# Patient Record
Sex: Male | Born: 1990 | Race: White | Hispanic: No | Marital: Single | Smoking: Former smoker
Health system: Southern US, Community
[De-identification: ages and names within clinical notes are randomized; demographics above are authoritative.]

## PROBLEM LIST (undated history)

## (undated) DIAGNOSIS — Z87820 Personal history of traumatic brain injury: Secondary | ICD-10-CM

## (undated) DIAGNOSIS — R4020 Unspecified coma: Secondary | ICD-10-CM

## (undated) DIAGNOSIS — S42309A Unspecified fracture of shaft of humerus, unspecified arm, initial encounter for closed fracture: Secondary | ICD-10-CM

## (undated) DIAGNOSIS — L709 Acne, unspecified: Secondary | ICD-10-CM

## (undated) DIAGNOSIS — T7840XA Allergy, unspecified, initial encounter: Secondary | ICD-10-CM

## (undated) HISTORY — PX: NO PAST SURGERIES: SHX2092

## (undated) HISTORY — DX: Unspecified coma: R40.20

## (undated) HISTORY — DX: Unspecified fracture of shaft of humerus, unspecified arm, initial encounter for closed fracture: S42.309A

## (undated) HISTORY — DX: Acne, unspecified: L70.9

## (undated) HISTORY — DX: Personal history of traumatic brain injury: Z87.820

## (undated) HISTORY — DX: Allergy, unspecified, initial encounter: T78.40XA

---
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
## 2015-10-09 ENCOUNTER — Telehealth: Payer: Self-pay | Admitting: Internal Medicine

## 2015-10-09 NOTE — Telephone Encounter (Signed)
Cancel s/w [employer]

## 2015-10-10 ENCOUNTER — Encounter: Payer: Self-pay | Admitting: Internal Medicine

## 2015-10-10 ENCOUNTER — Encounter: Payer: Self-pay | Admitting: Family Medicine

## 2015-10-10 ENCOUNTER — Ambulatory Visit (INDEPENDENT_AMBULATORY_CARE_PROVIDER_SITE_OTHER): Payer: 59 | Admitting: Internal Medicine

## 2015-10-10 VITALS — BP 112/70 | Temp 97.7°F | Ht 71.5 in | Wt 183.6 lb

## 2015-10-10 DIAGNOSIS — L259 Unspecified contact dermatitis, unspecified cause: Secondary | ICD-10-CM

## 2015-10-10 DIAGNOSIS — M545 Low back pain: Secondary | ICD-10-CM

## 2015-10-10 DIAGNOSIS — Z23 Encounter for immunization: Secondary | ICD-10-CM

## 2015-10-10 NOTE — Progress Notes (Signed)
Pre visit review using our clinic review tool, if applicable. No additional management support is needed unless otherwise documented below in the visit note.   Chief Complaint  Patient presents with  . Back Pain    Lower back pain started yesterday morning.    HPI: Nicholas Freeman 24 y.o. comes in  reestablishing  for new back pain  After  Walking dog.yesterday    Dropped leash once but no pain  At the time  Border collie.  Walks well  Sleep ok . No fever . No pain meds.    Pain is  Pressure  Mid low back and sometimes radiates to right buttock area  Not to foot . No weakness  Bowel bladder issue  . Worse with   7-9    When bend over  Down legs.  Slept ok .  After repositioning  Works 30 - 3 hours per week [employer] and [employer]   Standing trays  But no   Heavy lifting  No prec known injury fam hx back issues .  ROS: See pertinent positives and negatives per HPI. 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 Reg uarly runs adn works out but no new exercise  injury  Past Medical History  Diagnosis Date  . Allergy   . Chickenpox   . Arm fracture   . Acne     Family History  Problem Relation Age of Onset  . Heart disease Paternal Grandfather     Social History   Social History  . Marital Status: Single    Spouse Name: N/A  . Number of Children: N/A  . Years of Education: N/A   Social History Main Topics  . Smoking status: Never Smoker   . Smokeless tobacco: Never Used  . Alcohol Use: 0.0 oz/week    0 Standard drinks or equivalent per week     Comment: Social  . Drug Use: None  . Sexual Activity:    Partners: Female   Other Topics Concern  . 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 minocycline (MINOCIN,DYNACIN) 100 MG capsule Take 100 mg by mouth 2 (two) times daily.      . mometasone (NASONEX) 50 MCG/ACT nasal spray 2 sprays by Nasal route daily.      Marland Kitchen meningococcal polysaccharide (MENACTRA) injection 0.5 mL      No facility-administered medications prior to visit.     EXAM:  BP 112/70 mmHg  Temp(Src) 97.7 F (36.5 C) (Oral)  Ht 5' 11.5" (1.816 m)  Wt 183 lb 9.6 oz (83.28 kg)  BMI 25.25 kg/m2  Body mass index is 25.25 kg/(m^2).  GENERAL: vitals reviewed and listed above, alert, oriented, appears well hydrated and in no acute distress some uncomfortable with certain postitions HEENT: atraumatic, conjunctiva  clear, no obvious abnormalities on inspection of external nose and ears  Mild nasal congestion  NECK: no obvious masses  on inspection palpation  LUNGS: clear to auscultation bilaterally, no wheezes, rales or rhonchi, good air movement CV: HRRR, no clubbing cyanosis or  peripheral edema nl cap refill  Abdomen:  Sof,t normal bowel sounds without hepatosplenomegaly, no guarding rebound or masses no CVA tenderness skin contact derm below midline umbilicus  MS: moves all extremities without noticeable focal  Abnormality Back localized pain mid spine lumbar area  Heel to nl strength  Some sx with right leg raise  dtrs le intact  Nl tone   PSYCH: pleasant and cooperative, no obvious depression or anxiety  ASSESSMENT AND PLAN:  Discussed the following assessment and plan:  Acute midline low back pain, with sciatica presence unspecified - ? some righ radiation no alarm features   Need for prophylactic vaccination and inoculation against influenza - Plan: Flu Vaccine QUAD 36+ mos PF IM (Fluarix & Fluzone Quad PF)  Contact dermatitis - prob nickel metal belt  dsic managment   Only concern is young age and no injury although active job and  Exercise    Expectant management. Relative back rest  dsic  Note for work   One job return 10 29 activity as tolerated ice nsaids .  Exercise when improving and back hygiene Fu as instructed see below  Counseled. About expected course   -Patient advised to return or notify health care team  if symptoms worsen ,persist or new concerns arise. prob contact derm belt  Mid abd  Avoid metal  Contact  And add cortisone  To control  Patient Instructions  This is mechanical back pain .   No alarm features   .   Relative  back rest  For 1- 3 days  Ice  As discussed . Avoid  prolonged sitting  And heavy lifting until better .   Antiinflammatory   Doses  Ibuprofen 600 - 800 mg every 8 hours   Until improved .   When improved back exercises to maintain.   Fu if no  Improvement  In 2 weeks  Or not gone in 4 weeks.    Lumbosacral Strain Lumbosacral strain is a strain of any of the parts that make up your lumbosacral vertebrae. Your lumbosacral vertebrae are the bones that make up the lower third of your backbone. Your lumbosacral vertebrae are held together by muscles and tough, fibrous tissue (ligaments).  CAUSES  A sudden blow to your back can cause lumbosacral strain. Also, anything that causes an excessive stretch of the muscles in the low back can cause this strain. This is typically seen when people exert themselves strenuously, fall, lift heavy objects, bend, or crouch repeatedly. RISK FACTORS 1. Physically demanding work. 2. Participation in pushing or pulling sports or sports that require a sudden twist of the back (tennis, golf, baseball). 3. Weight lifting. 4. Excessive lower back curvature. 5. Forward-tilted pelvis. 6. Weak back or abdominal muscles or both. 7. Tight hamstrings. SIGNS AND SYMPTOMS  Lumbosacral strain may cause pain in the area of your injury or pain that moves (radiates) down your leg.  DIAGNOSIS Your  health care provider can often diagnose lumbosacral strain through a physical exam. In some cases, you may need tests such as X-ray exams.  TREATMENT  Treatment for your lower back injury depends on many factors that your clinician will have to evaluate. However, most treatment will include the use of anti-inflammatory medicines. HOME CARE INSTRUCTIONS  1. Avoid hard physical activities (tennis, racquetball, waterskiing) if you are not in proper physical condition for  it. This may aggravate or create problems. 2. If you have a back problem, avoid sports requiring sudden body movements. Swimming and walking are generally safer activities. 3. Maintain good posture. 4. Maintain a healthy weight. 5. For acute conditions, you may put ice on the injured area. 1. Put ice in a plastic bag. 2. Place a towel between your skin and the bag. 3. Leave the ice on for 20 minutes, 2-3 times a day. 6. When the low back starts healing, stretching and strengthening exercises may be recommended. SEEK MEDICAL CARE IF: 1. Your back pain is getting worse. 2. You experience severe back pain not relieved with medicines. SEEK IMMEDIATE MEDICAL CARE IF:  1. You have numbness, tingling, weakness, or problems with the use of your arms or legs. 2. There is a change in bowel or bladder control. 3. You have increasing pain in any area of the body, including your belly (abdomen). 4. You notice shortness of breath, dizziness, or feel faint. 5. You feel sick to your stomach (nauseous), are throwing up (vomiting), or become sweaty. 6. You notice discoloration of your toes or legs, or your feet get very cold. MAKE SURE YOU:  1. Understand these instructions. 2. Will watch your condition. 3. Will get help right away if you are not doing well or get worse.   This information is not intended to replace advice given to you by your health care provider. Make sure you discuss any questions you have with your health care provider.    Document Released: 09/09/2005 Document Revised: 12/21/2014 Document Reviewed: 07/19/2013 Elsevier Interactive Patient Education 2016 Elsevier Inc.  Back Exercises The following exercises strengthen the muscles that help to support the back. They also help to keep the lower back flexible. Doing these exercises can help to prevent back pain or lessen existing pain. If you have back pain or discomfort, try doing these exercises 2-3 times each day or as told by your health care provider. When the pain goes away, do them once each day, but increase the number of times that you repeat the steps for each exercise (do more repetitions). If you do not have back pain or discomfort, do these exercises once each day or as told by your health care provider. EXERCISES Single Knee to Chest Repeat these steps 3-5 times for each leg: 8. Lie on your back on a firm bed or the floor with your legs extended. 9. Bring one knee to your chest. Your other leg should stay extended and in contact with the floor. 10. Hold your knee in place by grabbing your knee or thigh. 11. Pull on your knee until you feel a gentle stretch in your lower back. 12. Hold the stretch for 10-30 seconds. 13. Slowly release and straighten your leg. Pelvic Tilt Repeat these steps 5-10 times: 7. Lie on your back on a firm bed or the floor with your legs extended. 8. Bend your knees so they are pointing toward the ceiling and your feet are flat on the floor. 9. Tighten your lower abdominal muscles to press your lower back against the floor. This motion will tilt your pelvis so your tailbone points up toward the ceiling instead of pointing to your feet or the floor. 10. With gentle tension and even breathing, hold this position for 5-10 seconds. Cat-Cow Repeat these steps until your lower back becomes more flexible: 3. Get into a hands-and-knees position on a firm surface. Keep your hands under your shoulders, and keep your knees under  your  hips. You may place padding under your knees for comfort. 4. Let your head hang down, and point your tailbone toward the floor so your lower back becomes rounded like the back of a cat. 5. Hold this position for 5 seconds. 6. Slowly lift your head and point your tailbone up toward the ceiling so your back forms a sagging arch like the back of a cow. 7. Hold this position for 5 seconds. Press-Ups Repeat these steps 5-10 times: 7. Lie on your abdomen (face-down) on the floor. 8. Place your palms near your head, about shoulder-width apart. 9. While you keep your back as relaxed as possible and keep your hips on the floor, slowly straighten your arms to raise the top half of your body and lift your shoulders. Do not use your back muscles to raise your upper torso. You may adjust the placement of your hands to make yourself more comfortable. 10. Hold this position for 5 seconds while you keep your back relaxed. 11. Slowly return to lying flat on the floor. Bridges Repeat these steps 10 times: 4. Lie on your back on a firm surface. 5. Bend your knees so they are pointing toward the ceiling and your feet are flat on the floor. 6. Tighten your buttocks muscles and lift your buttocks off of the floor until your waist is at almost the same height as your knees. You should feel the muscles working in your buttocks and the back of your thighs. If you do not feel these muscles, slide your feet 1-2 inches farther away from your buttocks. 7. Hold this position for 3-5 seconds. 8. Slowly lower your hips to the starting position, and allow your buttocks muscles to relax completely. If this exercise is too easy, try doing it with your arms crossed over your chest. Abdominal Crunches Repeat these steps 5-10 times: 1. Lie on your back on a firm bed or the floor with your legs extended. 2. Bend your knees so they are pointing toward the ceiling and your feet are flat on the floor. 3. Cross your arms over your  chest. 4. Tip your chin slightly toward your chest without bending your neck. 5. Tighten your abdominal muscles and slowly raise your trunk (torso) high enough to lift your shoulder blades a tiny bit off of the floor. Avoid raising your torso higher than that, because it can put too much stress on your low back and it does not help to strengthen your abdominal muscles. 6. Slowly return to your starting position. Back Lifts Repeat these steps 5-10 times: 1. Lie on your abdomen (face-down) with your arms at your sides, and rest your forehead on the floor. 2. Tighten the muscles in your legs and your buttocks. 3. Slowly lift your chest off of the floor while you keep your hips pressed to the floor. Keep the back of your head in line with the curve in your back. Your eyes should be looking at the floor. 4. Hold this position for 3-5 seconds. 5. Slowly return to your starting position. SEEK MEDICAL CARE IF:  Your back pain or discomfort gets much worse when you do an exercise.  Your back pain or discomfort does not lessen within 2 hours after you exercise. If you have any of these problems, stop doing these exercises right away. Do not do them again unless your health care provider says that you can. SEEK IMMEDIATE MEDICAL CARE IF:  You develop sudden, severe back pain. If this happens, stop  doing the exercises right away. Do not do them again unless your health care provider says that you can.   This information is not intended to replace advice given to you by your health care provider. Make sure you discuss any questions you have with your health care provider.   Document Released: 01/07/2005 Document Revised: 08/21/2015 Document Reviewed: 01/24/2015 Elsevier Interactive Patient Education 2016 [employer].        Gustine. Mattthew Ziomek M.D.

## 2015-10-10 NOTE — Patient Instructions (Signed)
This is mechanical back pain .   No alarm features   .   Relative  back rest  For 1- 3 days  Ice  As discussed . Avoid  prolonged sitting  And heavy lifting until better .   Antiinflammatory   Doses  Ibuprofen 600 - 800 mg every 8 hours   Until improved .   When improved back exercises to maintain.   Fu if no  Improvement  In 2 weeks  Or not gone in 4 weeks.    Lumbosacral Strain Lumbosacral strain is a strain of any of the parts that make up your lumbosacral vertebrae. Your lumbosacral vertebrae are the bones that make up the lower third of your backbone. Your lumbosacral vertebrae are held together by muscles and tough, fibrous tissue (ligaments).  CAUSES  A sudden blow to your back can cause lumbosacral strain. Also, anything that causes an excessive stretch of the muscles in the low back can cause this strain. This is typically seen when people exert themselves strenuously, fall, lift heavy objects, bend, or crouch repeatedly. RISK FACTORS 1. Physically demanding work. 2. Participation in pushing or pulling sports or sports that require a sudden twist of the back (tennis, golf, baseball). 3. Weight lifting. 4. Excessive lower back curvature. 5. Forward-tilted pelvis. 6. Weak back or abdominal muscles or both. 7. Tight hamstrings. SIGNS AND SYMPTOMS  Lumbosacral strain may cause pain in the area of your injury or pain that moves (radiates) down your leg.  DIAGNOSIS Your health care provider can often diagnose lumbosacral strain through a physical exam. In some cases, you may need tests such as X-ray exams.  TREATMENT  Treatment for your lower back injury depends on many factors that your clinician will have to evaluate. However, most treatment will include the use of anti-inflammatory medicines. HOME CARE INSTRUCTIONS  1. Avoid hard physical activities (tennis, racquetball, waterskiing) if you are not in proper physical condition for it. This may aggravate or create problems. 2. If  you have a back problem, avoid sports requiring sudden body movements. Swimming and walking are generally safer activities. 3. Maintain good posture. 4. Maintain a healthy weight. 5. For acute conditions, you may put ice on the injured area. 1. Put ice in a plastic bag. 2. Place a towel between your skin and the bag. 3. Leave the ice on for 20 minutes, 2-3 times a day. 6. When the low back starts healing, stretching and strengthening exercises may be recommended. SEEK MEDICAL CARE IF: 1. Your back pain is getting worse. 2. You experience severe back pain not relieved with medicines. SEEK IMMEDIATE MEDICAL CARE IF:  1. You have numbness, tingling, weakness, or problems with the use of your arms or legs. 2. There is a change in bowel or bladder control. 3. You have increasing pain in any area of the body, including your belly (abdomen). 4. You notice shortness of breath, dizziness, or feel faint. 5. You feel sick to your stomach (nauseous), are throwing up (vomiting), or become sweaty. 6. You notice discoloration of your toes or legs, or your feet get very cold. MAKE SURE YOU:  1. Understand these instructions. 2. Will watch your condition. 3. Will get help right away if you are not doing well or get worse.   This information is not intended to replace advice given to you by your health care provider. Make sure you discuss any questions you have with your health care provider.   Document Released: 09/09/2005 Document Revised: 12/21/2014  Document Reviewed: 07/19/2013 Elsevier Interactive Patient Education 2016 [employer]  Back Exercises The following exercises strengthen the muscles that help to support the back. They also help to keep the lower back flexible. Doing these exercises can help to prevent back pain or lessen existing pain. If you have back pain or discomfort, try doing these exercises 2-3 times each day or as told by your health care provider. When the pain goes away, do  them once each day, but increase the number of times that you repeat the steps for each exercise (do more repetitions). If you do not have back pain or discomfort, do these exercises once each day or as told by your health care provider. EXERCISES Single Knee to Chest Repeat these steps 3-5 times for each leg: 8. Lie on your back on a firm bed or the floor with your legs extended. 9. Bring one knee to your chest. Your other leg should stay extended and in contact with the floor. 10. Hold your knee in place by grabbing your knee or thigh. 11. Pull on your knee until you feel a gentle stretch in your lower back. 12. Hold the stretch for 10-30 seconds. 13. Slowly release and straighten your leg. Pelvic Tilt Repeat these steps 5-10 times: 7. Lie on your back on a firm bed or the floor with your legs extended. 8. Bend your knees so they are pointing toward the ceiling and your feet are flat on the floor. 9. Tighten your lower abdominal muscles to press your lower back against the floor. This motion will tilt your pelvis so your tailbone points up toward the ceiling instead of pointing to your feet or the floor. 10. With gentle tension and even breathing, hold this position for 5-10 seconds. Cat-Cow Repeat these steps until your lower back becomes more flexible: 3. Get into a hands-and-knees position on a firm surface. Keep your hands under your shoulders, and keep your knees under your hips. You may place padding under your knees for comfort. 4. Let your head hang down, and point your tailbone toward the floor so your lower back becomes rounded like the back of a cat. 5. Hold this position for 5 seconds. 6. Slowly lift your head and point your tailbone up toward the ceiling so your back forms a sagging arch like the back of a cow. 7. Hold this position for 5 seconds. Press-Ups Repeat these steps 5-10 times: 7. Lie on your abdomen (face-down) on the floor. 8. Place your palms near your head,  about shoulder-width apart. 9. While you keep your back as relaxed as possible and keep your hips on the floor, slowly straighten your arms to raise the top half of your body and lift your shoulders. Do not use your back muscles to raise your upper torso. You may adjust the placement of your hands to make yourself more comfortable. 10. Hold this position for 5 seconds while you keep your back relaxed. 11. Slowly return to lying flat on the floor. Bridges Repeat these steps 10 times: 4. Lie on your back on a firm surface. 5. Bend your knees so they are pointing toward the ceiling and your feet are flat on the floor. 6. Tighten your buttocks muscles and lift your buttocks off of the floor until your waist is at almost the same height as your knees. You should feel the muscles working in your buttocks and the back of your thighs. If you do not feel these muscles, slide your feet 1-2  inches farther away from your buttocks. 7. Hold this position for 3-5 seconds. 8. Slowly lower your hips to the starting position, and allow your buttocks muscles to relax completely. If this exercise is too easy, try doing it with your arms crossed over your chest. Abdominal Crunches Repeat these steps 5-10 times: 1. Lie on your back on a firm bed or the floor with your legs extended. 2. Bend your knees so they are pointing toward the ceiling and your feet are flat on the floor. 3. Cross your arms over your chest. 4. Tip your chin slightly toward your chest without bending your neck. 5. Tighten your abdominal muscles and slowly raise your trunk (torso) high enough to lift your shoulder blades a tiny bit off of the floor. Avoid raising your torso higher than that, because it can put too much stress on your low back and it does not help to strengthen your abdominal muscles. 6. Slowly return to your starting position. Back Lifts Repeat these steps 5-10 times: 1. Lie on your abdomen (face-down) with your arms at your  sides, and rest your forehead on the floor. 2. Tighten the muscles in your legs and your buttocks. 3. Slowly lift your chest off of the floor while you keep your hips pressed to the floor. Keep the back of your head in line with the curve in your back. Your eyes should be looking at the floor. 4. Hold this position for 3-5 seconds. 5. Slowly return to your starting position. SEEK MEDICAL CARE IF:  Your back pain or discomfort gets much worse when you do an exercise.  Your back pain or discomfort does not lessen within 2 hours after you exercise. If you have any of these problems, stop doing these exercises right away. Do not do them again unless your health care provider says that you can. SEEK IMMEDIATE MEDICAL CARE IF:  You develop sudden, severe back pain. If this happens, stop doing the exercises right away. Do not do them again unless your health care provider says that you can.   This information is not intended to replace advice given to you by your health care provider. Make sure you discuss any questions you have with your health care provider.   Document Released: 01/07/2005 Document Revised: 08/21/2015 Document Reviewed: 01/24/2015 Elsevier Interactive Patient Education [employer]

## 2020-01-29 ENCOUNTER — Ambulatory Visit: Payer: Self-pay | Attending: Internal Medicine

## 2020-01-29 DIAGNOSIS — Z23 Encounter for immunization: Secondary | ICD-10-CM | POA: Insufficient documentation

## 2020-01-29 NOTE — Progress Notes (Signed)
   Covid-19 Vaccination Clinic  Name:  Nicholas Freeman    MRN: [id number] DOB: 06/16/1991  01/29/2020  [name] was observed post Covid-19 immunization for 15 minutes without incidence. He was provided with Vaccine Information Sheet and instruction to access the V-Safe system.   [name] was instructed to call 911 with any severe reactions post vaccine: Marland Kitchen Difficulty breathing  . Swelling of your face and throat  . A fast heartbeat  . A bad rash all over your body  . Dizziness and weakness    Immunizations Administered    Name Date Dose VIS Date Route   Pfizer COVID-19 Vaccine 01/29/2020  7:19 PM 0.3 mL 11/24/2019 Intramuscular   Manufacturer: [employer], [employer]   Lot: [id number]   NDC: [address]-7

## 2020-02-20 ENCOUNTER — Ambulatory Visit: Payer: Self-pay | Attending: Internal Medicine

## 2020-02-20 DIAGNOSIS — Z23 Encounter for immunization: Secondary | ICD-10-CM

## 2020-02-20 NOTE — Progress Notes (Signed)
   Covid-19 Vaccination Clinic  Name:  Hugh Kamara    MRN: [id number] DOB: [birth year]  02/20/2020  Mr. Borges was observed post Covid-19 immunization for 15 minutes without incident. He was provided with Vaccine Information Sheet and instruction to access the V-Safe system.   Mr. Gilliand was instructed to call 911 with any severe reactions post vaccine: Marland Kitchen Difficulty breathing  . Swelling of face and throat  . A fast heartbeat  . A bad rash all over body  . Dizziness and weakness   Immunizations Administered    Name Date Dose VIS Date Route   Pfizer COVID-19 Vaccine 02/20/2020  4:37 PM 0.3 mL 11/24/2019 Intramuscular   Manufacturer: [employer], [employer]   Lot: [id number]   NDC: [address]-4

## 2020-02-21 ENCOUNTER — Ambulatory Visit: Payer: Self-pay

## 2020-03-05 ENCOUNTER — Emergency Department (HOSPITAL_COMMUNITY)
Admission: EM | Admit: 2020-03-05 | Discharge: 2020-03-05 | Disposition: A | Discharge status: Home / Self Care | Payer: BLUE CROSS/BLUE SHIELD | Attending: Emergency Medicine | Admitting: Emergency Medicine

## 2020-03-05 ENCOUNTER — Emergency Department (HOSPITAL_COMMUNITY): Payer: BLUE CROSS/BLUE SHIELD

## 2020-03-05 ENCOUNTER — Encounter (HOSPITAL_COMMUNITY): Payer: Self-pay | Admitting: Pharmacy Technician

## 2020-03-05 ENCOUNTER — Other Ambulatory Visit: Payer: Self-pay

## 2020-03-05 DIAGNOSIS — R55 Syncope and collapse: Secondary | ICD-10-CM | POA: Diagnosis present

## 2020-03-05 DIAGNOSIS — R4020 Unspecified coma: Secondary | ICD-10-CM

## 2020-03-05 LAB — COMPREHENSIVE METABOLIC PANEL
ALT: 40 U/L (ref 0–44)
AST: 25 U/L (ref 15–41)
Albumin: 4.3 g/dL (ref 3.5–5.0)
Alkaline Phosphatase: 65 U/L (ref 38–126)
Anion gap: 13 (ref 5–15)
BUN: 7 mg/dL (ref 6–20)
CO2: 19 mmol/L — ABNORMAL LOW (ref 22–32)
Calcium: 9.5 mg/dL (ref 8.9–10.3)
Chloride: 107 mmol/L (ref 98–111)
Creatinine, Ser: 1.01 mg/dL (ref 0.61–1.24)
GFR calc Af Amer: >60 mL/min (ref >60)
GFR calc non Af Amer: >60 mL/min (ref >60)
Glucose, Bld: 162 mg/dL — ABNORMAL HIGH (ref 70–99)
Potassium: 4.5 mmol/L (ref 3.5–5.1)
Sodium: 139 mmol/L (ref 135–145)
Total Bilirubin: 0.9 mg/dL (ref 0.3–1.2)
Total Protein: 6.9 g/dL (ref 6.5–8.1)

## 2020-03-05 LAB — RAPID URINE DRUG SCREEN, HOSP PERFORMED
Amphetamines: NOT DETECTED
Barbiturates: NOT DETECTED
Benzodiazepines: NOT DETECTED
Cocaine: NOT DETECTED
Opiates: NOT DETECTED
Tetrahydrocannabinol: POSITIVE — AB

## 2020-03-05 LAB — URINALYSIS, ROUTINE W REFLEX MICROSCOPIC
Bacteria, UA: NONE SEEN
Bilirubin Urine: NEGATIVE
Glucose, UA: 50 mg/dL — AB
Ketones, ur: NEGATIVE mg/dL
Leukocytes,Ua: NEGATIVE
Nitrite: NEGATIVE
Protein, ur: 100 mg/dL — AB
Specific Gravity, Urine: 1.014 (ref 1.005–1.030)
pH: 5 (ref 5.0–8.0)

## 2020-03-05 LAB — CBC WITH DIFFERENTIAL/PLATELET
Abs Immature Granulocytes: 0.1 10*3/uL — ABNORMAL HIGH (ref 0.00–0.07)
Basophils Absolute: 0.1 10*3/uL (ref 0.0–0.1)
Basophils Relative: 1 %
Eosinophils Absolute: 0.1 10*3/uL (ref 0.0–0.5)
Eosinophils Relative: 2 %
HCT: 51.6 % (ref 39.0–52.0)
Hemoglobin: 17.2 g/dL — ABNORMAL HIGH (ref 13.0–17.0)
Immature Granulocytes: 2 %
Lymphocytes Relative: 20 %
Lymphs Abs: 1.1 10*3/uL (ref 0.7–4.0)
MCH: 30.2 pg (ref 26.0–34.0)
MCHC: 33.3 g/dL (ref 30.0–36.0)
MCV: 90.7 fL (ref 80.0–100.0)
Monocytes Absolute: 0.3 10*3/uL (ref 0.1–1.0)
Monocytes Relative: 6 %
Neutro Abs: 3.9 10*3/uL (ref 1.7–7.7)
Neutrophils Relative %: 69 %
Platelets: UNDETERMINED 10*3/uL (ref 150–400)
RBC: 5.69 MIL/uL (ref 4.22–5.81)
RDW: 12.4 % (ref 11.5–15.5)
WBC: 5.6 10*3/uL (ref 4.0–10.5)
nRBC: 0 % (ref 0.0–0.2)

## 2020-03-05 LAB — CBG MONITORING, ED: Glucose-Capillary: 146 mg/dL — ABNORMAL HIGH (ref 70–99)

## 2020-03-05 MED ORDER — SODIUM CHLORIDE 0.9 % IV BOLUS
1000.0000 mL | Freq: Once | INTRAVENOUS | Status: AC
  Administered 2020-03-05: 11:00:00 1000 mL via INTRAVENOUS

## 2020-03-05 MED ORDER — LEVETIRACETAM IN NACL 1500 MG/100ML IV SOLN
1500.0000 mg | Freq: Once | INTRAVENOUS | Status: AC
  Administered 2020-03-05: 1500 mg via INTRAVENOUS
  Filled 2020-03-05: qty 100

## 2020-03-05 MED ORDER — LEVETIRACETAM 500 MG PO TABS: 500.0000 mg | ORAL_TABLET | Freq: Two times a day (BID) | ORAL | 0 refills | Status: DC

## 2020-03-05 MED ORDER — ACETAMINOPHEN 500 MG PO TABS
1000.0000 mg | ORAL_TABLET | Freq: Once | ORAL | Status: AC
  Administered 2020-03-05: 1000 mg via ORAL
  Filled 2020-03-05: qty 2

## 2020-03-05 NOTE — ED Provider Notes (Signed)
[facility] EMERGENCY DEPARTMENT Provider Note   CSN: [id number] Arrival date & time: 03/05/20  1042     History Chief Complaint  Patient presents with  . Loss of Consciousness    Nicholas Freeman is a 29 y.o. male.  HPI   Patient is a 29 year old male with a history of acne, allergies, who presents the emergency department today for evaluation for loss of consciousness.  Patient states he woke up this morning and was very sweaty.  He walked to the bathroom to take a shower and sat on the toilet.  He continued to feel diaphoretic and lightheaded.  He also had some tingling in his bilateral hands.  He stood up from the toilet and attempted to get in the shower and the next thing he knew he was found on the floor by his mother.  Per EMS, patient's mother did not witness any tonic-clonic movements or seizure-like activity though on EMS arrival they felt that he was postictal as he was disoriented.  There was no urinary incontinence and there is no report of tongue biting.  Patient reports he has been in his normal state of health prior to the episode today.    He did have a similar episode that occurred a few months ago.  He was evaluated by his PCP and told to rest and take it easy.  He did not have any further follow-up after this incident.  He denies any recent fevers, visual changes, dizziness, lightheadedness, unilateral numbness/weakness. He does have headache currently. He denies any injuries or pain from the fall. Denies any frequent use of alcohol.  He does use marijuana but denies other drug use.  Past Medical History:  Diagnosis Date  . Acne   . Allergy   . Arm fracture   . Chickenpox     Patient Active Problem List   Diagnosis Date Noted  . Immunization due 02/26/2011  . ABDOMINAL PAIN OTHER SPECIFIED SITE 02/28/2010  . INJURY OF FACE AND NECK OTHER AND UNSPECIFIED 02/28/2010    Past Surgical History:  Procedure Laterality Date  . NO PAST SURGERIES          Family History  Problem Relation Age of Onset  . Heart disease Paternal Grandfather     Social History   Tobacco Use  . Smoking status: Never Smoker  . Smokeless tobacco: Never Used  Substance Use Topics  . Alcohol use: Yes    Alcohol/week: 0.0 standard drinks    Comment: Social  . Drug use: Not on file    Home Medications Prior to Admission medications   Medication Sig Start Date End Date Taking? Authorizing Provider  levETIRAcetam (KEPPRA) 500 MG tablet Take 1 tablet (500 mg total) by mouth 2 (two) times daily. 03/05/20 05/04/20  Kellyann Ordway S, PA-C    Allergies    Shellfish allergy  Review of Systems   Review of Systems  Constitutional: Negative for fever.  HENT: Negative for ear pain and sore throat.   Eyes: Negative for visual disturbance.  Respiratory: Negative for cough and shortness of breath.   Cardiovascular: Negative for chest pain.  Gastrointestinal: Negative for abdominal pain, constipation, diarrhea, nausea and vomiting.  Genitourinary: Negative for dysuria and hematuria.  Musculoskeletal: Negative for back pain.  Skin: Negative for rash.  Neurological: Positive for headaches. Negative for dizziness, weakness, light-headedness and numbness.       Loss of consciousness  All other systems reviewed and are negative.   Physical Exam Updated Vital  Signs BP (!) 121/100   Pulse 78   Temp (!) 97.4 F (36.3 C) (Oral)   Resp (!) 32   Ht 5\' 11"  (1.803 m)   Wt 87.1 kg   SpO2 100%   BMI 26.78 kg/m   Physical Exam Vitals and nursing note reviewed.  Constitutional:      Appearance: He is well-developed.  HENT:     Head: Normocephalic and atraumatic.     Mouth/Throat:     Comments: No bruising to the tongue. Mucous membranes are dry.  Eyes:     Extraocular Movements: Extraocular movements intact.     Conjunctiva/sclera: Conjunctivae normal.     Pupils: Pupils are equal, round, and reactive to light.     Comments: No nystagmus   Cardiovascular:     Rate and Rhythm: Normal rate and regular rhythm.     Pulses: Normal pulses.     Heart sounds: Normal heart sounds. No murmur.  Pulmonary:     Effort: Pulmonary effort is normal. No respiratory distress.     Breath sounds: Normal breath sounds. No wheezing, rhonchi or rales.  Abdominal:     General: Bowel sounds are normal.     Palpations: Abdomen is soft.     Tenderness: There is no abdominal tenderness. There is no guarding or rebound.  Musculoskeletal:     Cervical back: Neck supple.  Skin:    General: Skin is warm and dry.  Neurological:     Mental Status: He is alert.     Comments: Mental Status:  Alert, thought content appropriate, able to give a coherent history. Speech fluent without evidence of aphasia. Able to follow 2 step commands without difficulty.  Cranial Nerves:  II:  Peripheral visual fields grossly normal, pupils equal, round, reactive to light III,IV, VI: ptosis not present, extra-ocular motions intact bilaterally  V,VII: smile symmetric, facial light touch sensation equal VIII: hearing grossly normal to voice  X: uvula elevates symmetrically  XI: bilateral shoulder shrug symmetric and strong XII: midline tongue extension without fassiculations Motor:  Normal tone. 5/5 strength of BUE and BLE major muscle groups including strong and equal grip strength and dorsiflexion/plantar flexion Sensory: light touch normal in all extremities. Gait: normal gait and balance.       ED Results / Procedures / Treatments   Labs (all labs ordered are listed, but only abnormal results are displayed) Labs Reviewed  CBC WITH DIFFERENTIAL/PLATELET - Abnormal; Notable for the following components:      Result Value   Hemoglobin 17.2 (*)    Abs Immature Granulocytes 0.10 (*)    All other components within normal limits  COMPREHENSIVE METABOLIC PANEL - Abnormal; Notable for the following components:   CO2 19 (*)    Glucose, Bld 162 (*)    All other  components within normal limits  URINALYSIS, ROUTINE W REFLEX MICROSCOPIC - Abnormal; Notable for the following components:   Glucose, UA 50 (*)    Hgb urine dipstick SMALL (*)    Protein, ur 100 (*)    All other components within normal limits  RAPID URINE DRUG SCREEN, HOSP PERFORMED - Abnormal; Notable for the following components:   Tetrahydrocannabinol POSITIVE (*)    All other components within normal limits  CBG MONITORING, ED - Abnormal; Notable for the following components:   Glucose-Capillary 146 (*)    All other components within normal limits    EKG EKG Interpretation  Date/Time:  Tuesday March 05 2020 10:43:15 EDT Ventricular Rate:  83  PR Interval:    QRS Duration: 99 QT Interval:  368 QTC Calculation: 433 R Axis:   88 Text Interpretation: Sinus rhythm ST elev, probable normal early repol pattern No STEMI Confirmed by Nanda Quinton [phone]) on 03/05/2020 11:32:14 AM   Radiology CT Head Wo Contrast  Result Date: 03/05/2020 CLINICAL DATA:  Recent seizure activity and loss of consciousness EXAM: CT HEAD WITHOUT CONTRAST TECHNIQUE: Contiguous axial images were obtained from the base of the skull through the vertex without intravenous contrast. COMPARISON:  03/06/2008 FINDINGS: Brain: No evidence of acute infarction, hemorrhage, hydrocephalus, extra-axial collection or mass lesion/mass effect. Vascular: No hyperdense vessel or unexpected calcification. Skull: Normal. Negative for fracture or focal lesion. Sinuses/Orbits: No acute finding. Other: None. IMPRESSION: Normal head CT for age Electronically Signed   By: Inez Catalina M.D.   On: 03/05/2020 11:47    Procedures Procedures (including critical care time)  Medications Ordered in ED Medications  levETIRAcetam (KEPPRA) IVPB 1500 mg/ 100 mL premix (has no administration in time range)  sodium chloride 0.9 % bolus 1,000 mL (1,000 mLs Intravenous New Bag/Given 03/05/20 1113)  acetaminophen (TYLENOL) tablet 1,000 mg (1,000 mg  Oral Given 03/05/20 1112)    ED Course  I have reviewed the triage vital signs and the nursing notes.  Pertinent labs & imaging results that were available during my care of the patient were reviewed by me and considered in my medical decision making (see chart for details).    MDM Rules/Calculators/A&P                      29 y/o male presenting for eval of loss of consciousness.  Reviewed/interpreted labs CBC with no leukocytosis, elevated hemoglobin at 17.2. CMP with no significant electrolyte derangement.  Normal kidney and liver function.  Bicarb slightly low which could be consistent with recent seizure. UA with glucosuria, hematuria and proteinuria.  No evidence for UTI UDS positive for THC  EKG with NSR, st elevation likely early repol  CT head neg for acute abnormality.  This was personally reviewed by myself and I agree with the radiologist  It is unclear if patient's episode today was related to syncope versus seizure however with reports of postictal state from EMS and concerned that this is the second episode he has had within the last several months, will consult neurology for further recommendations.  1:27 PM CONSULT with Dr. Rory Percy with neurology who recommended loading the patient with 1500 of IV Keppra.  Recommend starting the patient on 500 of Keppra twice daily and have him follow-up with neurology as an outpatient.  Reassessed patient.  He continues to be neurologically intact.  Discussed findings and plan for discharge on Keppra.  Advised on seizure precautions and advised not to drive until he can be cleared by neurology.   Advised on specific return precautions.  He voiced understanding plan reasons return.  All questions answered.  Patient stable for discharge.   Final Clinical Impression(s) / ED Diagnoses Final diagnoses:  Loss of consciousness ([place])    Rx / DC Orders ED Discharge Orders         Ordered    levETIRAcetam (KEPPRA) 500 MG tablet  2 times  daily     03/05/20 1343    Ambulatory referral to Neurology    Comments: An appointment is requested in approximately: 4 weeks   03/05/20 1343           Binyomin Brann S, PA-C 03/05/20 1344  Maia Plan, MD 03/11/20 [phone]

## 2020-03-05 NOTE — ED Triage Notes (Signed)
Pt arrives to ED from home with complaints of an episode of loss of consciousness this morning. Per patient he started to get diaphoretic and lightheaded this morning and experienced numbness and tingling in his hands. Patient states he went and sat on the toilet and that's the last thing he remembers. Per mother, patient was found in the bathtub after hearing him fall, disoriented x4. Per EMS patient was post ictal on their arrival but patient came too shortly after their arrival. Patient denies neck or head pain.

## 2020-03-05 NOTE — Discharge Instructions (Signed)
We are concerned that you may have had a seizure today.  Would like to start you on a medication.  You will need to take Keppra twice daily until you can follow-up with neurology.  You should not drive a car, take a bath, swim, or put yourself in any situations which could lead to injury should you have a seizure.  You were given a referral to neurology.  The neurology office will call you to set up an appointment for follow-up.  Please return the emergency department for any new or worsening symptoms in the meantime.

## 2020-04-10 ENCOUNTER — Encounter: Payer: Self-pay | Admitting: Diagnostic Neuroimaging

## 2020-04-10 ENCOUNTER — Ambulatory Visit: Payer: BLUE CROSS/BLUE SHIELD | Admitting: Diagnostic Neuroimaging

## 2020-04-10 ENCOUNTER — Other Ambulatory Visit: Payer: Self-pay

## 2020-04-10 VITALS — BP 135/77 | HR 94 | Temp 97.9°F | Ht 71.5 in | Wt 193.4 lb

## 2020-04-10 DIAGNOSIS — R4020 Unspecified coma: Secondary | ICD-10-CM | POA: Diagnosis not present

## 2020-04-10 MED ORDER — LEVETIRACETAM 500 MG PO TABS: 500.0000 mg | ORAL_TABLET | Freq: Two times a day (BID) | ORAL | 4 refills | Status: DC

## 2020-04-10 NOTE — Patient Instructions (Signed)
  ABNORMAL SPELLS (LOC, bit tongue, convulsions)  - continue levetiracetam 500mg  twice a day   - check MRI brain, EEG  - According to  law, you can not drive unless you are seizure / syncope free for at least 6 months and under physician's care.   - Please maintain precautions. Do not participate in activities where a loss of awareness could harm you or someone else. No swimming alone, no tub bathing, no hot tubs, no driving, no operating motorized vehicles (cars, ATVs, motocycles, etc), lawnmowers, power tools or firearms. No standing at heights, such as rooftops, ladders or stairs. Avoid hot objects such as stoves, heaters, open fires. Wear a helmet when riding a bicycle, scooter, skateboard, etc. and avoid areas of traffic. Set your water heater to 120 degrees or less.

## 2020-04-10 NOTE — Progress Notes (Signed)
[facility]  PATIENT: Nicholas Freeman DOB: [birth year]  REFERRING CLINICIAN: Couture, Cortni S, PA-C HISTORY FROM: patient  REASON FOR VISIT: new consult    HISTORICAL  CHIEF COMPLAINT:  Chief Complaint  Patient presents with  . Loss of Consciousness    rm 6 ED referral, mom- Patty "03/05/20 was in bathroom, got lightheaded, sweaty and collapsed, came to in about 15 minutes; no memory of event, he bit his lips; had similar event last Sept- bit tongue then also"    HISTORY OF PRESENT ILLNESS:   29 year old male here for evaluation of seizures.  03/05/2020 patient woke up, went to take a shower.  He felt a little bit lightheaded and sweaty.  The next thing he knows he woke up on his bed.  Apparently he had a collapsed and had seizure-like activity.  His mother heard some banging sound upstairs in the bathroom.  When she came to see him he was unconscious with blood around his mouth.  Apparently he bit his lip.  When paramedics came he was confused, had some memory loss, and took a while to become awake and alert.  Patient went to the ER for evaluation.  CT head and lab testing unremarkable.  Patient was started on antiseizure medication because he had another event a few months prior where he passed out.  That event happened after he had gone for a very long run, felt dehydrated, lightheaded and passed out at home.  Patient has had several concussions when he was younger.  No prior seizures.  No family history of seizures.  REVIEW OF SYSTEMS: Full 14 system review of systems performed and negative with exception of: As per HPI.  ALLERGIES: Allergies  Allergen Reactions  . Shellfish Allergy Nausea And Vomiting    HOME MEDICATIONS: Outpatient Medications Prior to Visit  Medication Sig Dispense Refill  . cetirizine (ZYRTEC) 10 MG tablet Take 10 mg by mouth as needed for allergies.    Marland Kitchen levETIRAcetam (KEPPRA) 500 MG tablet Take 1 tablet (500 mg total) by mouth 2  (two) times daily. 120 tablet 0   No facility-administered medications prior to visit.    PAST MEDICAL HISTORY: Past Medical History:  Diagnosis Date  . Acne   . Allergy   . Arm fracture   . Chickenpox   . H/O multiple concussions    high school, lacrosse  . LOC (loss of consciousness) (HCC) 08/2019, 02/2020    PAST SURGICAL HISTORY: Past Surgical History:  Procedure Laterality Date  . NO PAST SURGERIES      FAMILY HISTORY: Family History  Problem Relation Age of Onset  . Heart disease Paternal Grandfather   . Alzheimer's disease Maternal Grandfather   . Heart disease Maternal Grandfather     SOCIAL HISTORY: Social History   Socioeconomic History  . Marital status: Single    Spouse name: Not on file  . Number of children: 0  . Years of education: Not on file  . Highest education level: Bachelor's degree (e.g., BA, AB, BS)  Occupational History  . Not on file  Tobacco Use  . Smoking status: Former Smoker    Quit date: 04/10/2017    Years since quitting: 3.0  . Smokeless tobacco: Never Used  Substance and Sexual Activity  . Alcohol use: Yes    Alcohol/week: 0.0 standard drinks    Comment: Social  . Drug use: Yes    Types: Marijuana    Comment: 04/10/20 socially with alcohol  . Sexual activity: Yes  Partners: Female  Other Topics Concern  . 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 Difficulty of Paying Living Expenses:   Food Insecurity:   . Worried About Charity fundraiser in the Last Year:   . Arboriculturist in the Last Year:   Transportation Needs:   . Film/video editor (Medical):   Marland Kitchen Lack of Transportation (Non-Medical):   Physical  Activity:   . Days of Exercise per Week:   . Minutes of Exercise per Session:   Stress:   . Feeling of Stress :   Social Connections:   . Frequency of Communication with Friends and Family:   . Frequency of Social Gatherings with Friends and Family:   . Attends Religious Services:   . Active Member of Clubs or Organizations:   . Attends Archivist Meetings:   Marland Kitchen Marital Status:   Intimate Partner Violence:   . Fear of Current or Ex-Partner:   . Emotionally Abused:   Marland Kitchen Physically Abused:   . Sexually Abused:      PHYSICAL EXAM  GENERAL EXAM/CONSTITUTIONAL: Vitals:  Vitals:   04/10/20 1119  BP: 135/77  Pulse: 94  Temp: 97.9 F (36.6 C)  Weight: 193 lb 6.4 oz (87.7 kg)  Height: 5' 11.5" (1.816 m)     Body mass index is 26.6 kg/m. Wt Readings from Last 3 Encounters:  04/10/20 193 lb 6.4 oz (87.7 kg)  03/05/20 192 lb (87.1 kg)  10/10/15 183 lb 9.6 oz (83.3 kg)     Patient is in no distress; well developed, nourished and groomed; neck is supple  CARDIOVASCULAR:  Examination of carotid arteries is normal; no carotid bruits  Regular rate and rhythm, no murmurs  Examination of peripheral vascular system by observation and palpation is normal  EYES:  Ophthalmoscopic exam of optic discs and posterior segments is normal; no papilledema or hemorrhages  No exam data present  MUSCULOSKELETAL:  Gait, strength, tone, movements noted in Neurologic exam below  NEUROLOGIC: MENTAL STATUS:  No flowsheet data found.  awake, alert, oriented to person, place and time  recent and remote memory intact  normal attention and concentration  language fluent, comprehension intact, naming intact  fund of knowledge appropriate  CRANIAL NERVE:   2nd - no papilledema on fundoscopic exam  2nd, 3rd, 4th, 6th - pupils equal and reactive to light, visual fields full to confrontation, extraocular muscles intact, no nystagmus  5th - facial sensation symmetric  7th  - facial strength symmetric  8th - hearing intact  9th - palate elevates symmetrically, uvula midline  11th - shoulder shrug symmetric  12th - tongue protrusion midline  MOTOR:   normal bulk and tone, full strength in the BUE, BLE  SENSORY:   normal and symmetric to light touch, temperature, vibration  COORDINATION:   finger-nose-finger, fine finger movements normal  REFLEXES:   deep tendon reflexes present and symmetric  GAIT/STATION:   narrow based gait     DIAGNOSTIC DATA (LABS, IMAGING, TESTING) - I reviewed patient records, labs, notes, testing and imaging myself where available.  Lab Results  Component  Value Date   WBC 5.6 03/05/2020   HGB 17.2 (H) 03/05/2020   HCT 51.6 03/05/2020   MCV 90.7 03/05/2020   PLT PLATELET CLUMPS NOTED ON SMEAR, UNABLE TO ESTIMATE 03/05/2020      Component Value Date/Time   NA 139 03/05/2020 1059   K 4.5 03/05/2020 1059   CL 107 03/05/2020 1059   CO2 19 (L) 03/05/2020 1059   GLUCOSE 162 (H) 03/05/2020 1059   BUN 7 03/05/2020 1059   CREATININE 1.01 03/05/2020 1059   CALCIUM 9.5 03/05/2020 1059   PROT 6.9 03/05/2020 1059   ALBUMIN 4.3 03/05/2020 1059   AST 25 03/05/2020 1059   ALT 40 03/05/2020 1059   ALKPHOS 65 03/05/2020 1059   BILITOT 0.9 03/05/2020 1059   GFRNONAA >60 03/05/2020 1059   GFRAA >60 03/05/2020 1059   No results found for: CHOL, HDL, LDLCALC, LDLDIRECT, TRIG, CHOLHDL No results found for: WNIO2V No results found for: VITAMINB12 No results found for: TSH   03/05/20 CT head [I reviewed images myself and agree with interpretation. -VRP]  - negative    ASSESSMENT AND PLAN  29 y.o. year old male here with new onset LOC suspicious for seizure (03/05/20). Prior event syncope vs seizure.   Dx:  1. Loss of consciousness (HCC)      PLAN:  ABNORMAL SPELLS (LOC, bit tongue, convulsions; March 05, 2020 and Sept 2020)  - continue levetiracetam 500mg  twice a day   - check MRI brain, EEG  -  According to [facility] law, you can not drive unless you are seizure / syncope free for at least 6 months and under physician's care.   - Please maintain precautions. Do not participate in activities where a loss of awareness could harm you or someone else. No swimming alone, no tub bathing, no hot tubs, no driving, no operating motorized vehicles (cars, ATVs, motocycles, etc), lawnmowers, power tools or firearms. No standing at heights, such as rooftops, ladders or stairs. Avoid hot objects such as stoves, heaters, open fires. Wear a helmet when riding a bicycle, scooter, skateboard, etc. and avoid areas of traffic. Set your water heater to 120 degrees or less.   Orders Placed This Encounter  Procedures  . MR BRAIN W WO CONTRAST  . EEG adult   Meds ordered this encounter  Medications  . levETIRAcetam (KEPPRA) 500 MG tablet    Sig: Take 1 tablet (500 mg total) by mouth 2 (two) times daily.    Dispense:  180 tablet    Refill:  4   Return in about 6 months (around 10/10/2020).  I reviewed images, labs, notes, records myself. I summarized findings and reviewed with patient, for this high risk condition (seizure) requiring high complexity decision making.   10/12/2020, MD 04/10/2020, 11:38 AM Certified in Neurology, Neurophysiology and Neuroimaging  [facility] [address], [address] [place], [place] [phone]

## 2020-04-16 ENCOUNTER — Telehealth: Payer: Self-pay | Admitting: Diagnostic Neuroimaging

## 2020-04-16 NOTE — Telephone Encounter (Signed)
Patient has [facility] so they are who is in network for the MRI.   [employer] Auth: [id number] (exp. 04/16/20 to 10/12/20) order faxed to [facility] They will reach out to the patient to schedule. Phone number [phone] & fax # [phone].

## 2020-05-01 ENCOUNTER — Telehealth: Payer: Self-pay | Admitting: *Deleted

## 2020-05-01 NOTE — Telephone Encounter (Signed)
Received faxed report MRI brain form [facility], report placed on MD's desk for review.

## 2020-05-06 NOTE — Telephone Encounter (Signed)
Normal MRI. -VRP

## 2020-05-07 ENCOUNTER — Encounter: Payer: Self-pay | Admitting: *Deleted

## 2020-05-07 NOTE — Telephone Encounter (Signed)
Sent my chart:  Dr Marjory Lies reviewed your MRI brain results, and they are normal. Please continue taking levetiracetam500mg twice a day.Your EEG is on 05/08/20 and will give Dr Marjory Lies more information.   According to [place] law, you can not drive unless you are seizure / syncope free for at least 6 months and under physician's care

## 2020-05-08 ENCOUNTER — Other Ambulatory Visit: Payer: Self-pay

## 2020-05-08 ENCOUNTER — Ambulatory Visit: Payer: BLUE CROSS/BLUE SHIELD | Admitting: Diagnostic Neuroimaging

## 2020-05-08 DIAGNOSIS — R4020 Unspecified coma: Secondary | ICD-10-CM

## 2020-05-10 NOTE — Procedures (Signed)
   [facility]  EEG (ELECTROENCEPHALOGRAM) REPORT   STUDY DATE: 05/08/20 PATIENT NAME: Nicholas Freeman DOB: March 15, 1991 MRN: [id number]  ORDERING CLINICIAN: Joycelyn Schmid, MD   TECHNOLOGIST: Ardis Hughs TECHNIQUE: Electroencephalogram was recorded utilizing standard 10-20 system of lead placement and reformatted into average and bipolar montages.  RECORDING TIME: 27 minutes ACTIVATION: hyperventilation and photic stimulation  CLINICAL INFORMATION: 29 year old male with loss of consciousness.  FINDINGS: Posterior dominant background rhythms, which attenuate with eye opening, ranging 10-11 hertz and 20-30 microvolts. No focal, lateralizing, epileptiform activity or seizures are seen. Patient recorded in the awake and drowsy state. EKG channel shows regular rhythm of 70-80 beats per minute.   IMPRESSION:   Normal EEG in the awake and drowsy states.    INTERPRETING PHYSICIAN:  Suanne Marker, MD Certified in Neurology, Neurophysiology and Neuroimaging  [facility] [address], [address] [place], [place] [address] [phone]

## 2020-05-14 ENCOUNTER — Telehealth: Payer: Self-pay | Admitting: *Deleted

## 2020-05-14 ENCOUNTER — Telehealth: Payer: Self-pay | Admitting: Diagnostic Neuroimaging

## 2020-05-14 NOTE — Telephone Encounter (Signed)
Called patient and informed him levetiracetam has refills. He stated the bottle he has says no refills. He read label and I advised it is from ED visit. I advised [facility] has new Rx from Dr Danae Orleans on 04/10/20. We confirmed dose, strength, directions are the same. Patient verbalized understanding, appreciation.

## 2020-05-14 NOTE — Telephone Encounter (Signed)
Pt is needing a refill on his levETIRAcetam (KEPPRA) 500 MG tablet sent in to the [facility] on [address]

## 2020-05-14 NOTE — Telephone Encounter (Signed)
LVM informing patient his EEG was normal. Advised him Dr Marjory Lies wants him to continue taking levetiracetam 500 mg twice daily; [place] DMV law of no driving still applies to him until he is 6 month seizure, syncope free. Left # for questions.

## 2020-08-13 ENCOUNTER — Telehealth: Payer: Self-pay | Admitting: *Deleted

## 2020-08-13 ENCOUNTER — Ambulatory Visit: Payer: BLUE CROSS/BLUE SHIELD | Admitting: Diagnostic Neuroimaging

## 2020-08-13 ENCOUNTER — Encounter: Payer: Self-pay | Admitting: Diagnostic Neuroimaging

## 2020-08-13 NOTE — Telephone Encounter (Signed)
Patient was no show for follow up today. 

## 2020-08-20 ENCOUNTER — Telehealth: Payer: Self-pay | Admitting: Diagnostic Neuroimaging

## 2020-08-20 NOTE — Telephone Encounter (Signed)
Can setup virtual visit if needed. Otherwise continue levetiracetam. -VRP

## 2020-08-20 NOTE — Telephone Encounter (Signed)
Called mother and asked if patient had side effects to medication. 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 He's completed tests. She gave him levetiracetam today following 2nd seizure.   She described seizures,  "he fell on floor, but the seizure was not as bad as previous one. The 2nd he was laying down, his arms got stiff, he had tremors and bit tongue."  I advised he take medicine as prescribed,  rest today and if further seizure activity he may need to go to ED. She wants to avoid ED if at all possible. We scheduled a FU; he was no show for past FU. She asked if he needed a second medication. I advised will inform  MD of his seizures today and call her with any further recommendations, instructions. Mom  verbalized understanding, appreciation.

## 2020-08-20 NOTE — Telephone Encounter (Signed)
Called patient's other and advised her to have son continue taking medication as prescribed; we will see him for FU next Monday. Call for any problems. She verbalized understanding, appreciation.'

## 2020-08-20 NOTE — Telephone Encounter (Signed)
Pt's mother Clayborne Dana on [place] called stating that the pt has had a seizure this morning at 7am and another one at 10am. Pt has stopped taking his levETIRAcetam (KEPPRA) 500 MG tablet by his own accord. Mother would like to be advised.

## 2020-08-26 ENCOUNTER — Ambulatory Visit (INDEPENDENT_AMBULATORY_CARE_PROVIDER_SITE_OTHER): Payer: BC Managed Care – PPO | Admitting: Diagnostic Neuroimaging

## 2020-08-26 ENCOUNTER — Encounter: Payer: Self-pay | Admitting: Diagnostic Neuroimaging

## 2020-08-26 ENCOUNTER — Other Ambulatory Visit: Payer: Self-pay

## 2020-08-26 VITALS — BP 118/70 | HR 103 | Ht 71.5 in | Wt 186.4 lb

## 2020-08-26 DIAGNOSIS — G40909 Epilepsy, unspecified, not intractable, without status epilepticus: Secondary | ICD-10-CM | POA: Insufficient documentation

## 2020-08-26 MED ORDER — LEVETIRACETAM 500 MG PO TABS: 500.0000 mg | ORAL_TABLET | Freq: Two times a day (BID) | ORAL | 4 refills | Status: AC

## 2020-08-26 NOTE — Progress Notes (Signed)
[facility]  PATIENT: Nicholas Freeman DOB: [birth year]  REFERRING CLINICIAN: Panosh, Neta Mends, MD HISTORY FROM: patient  REASON FOR VISIT: follow up   HISTORICAL  CHIEF COMPLAINT:  Chief Complaint  Patient presents with  . Follow-up    RM 6. Here for seizure f/u    HISTORY OF PRESENT ILLNESS:   UPDATE (08/26/20, VRP): Since last visit, had another seizure on 08/20/20 (patient had stopped LEV on his own x 1-2 months; was having side effects, and trying to study for exams).  PRIOR HPI: 29 year old male here for evaluation of seizures.  03/05/2020 patient woke up, went to take a shower.  He felt a little bit lightheaded and sweaty.  The next thing he knows he woke up on his bed.  Apparently he had a collapsed and had seizure-like activity.  His mother heard some banging sound upstairs in the bathroom.  When she came to see him he was unconscious with blood around his mouth.  Apparently he bit his lip.  When paramedics came he was confused, had some memory loss, and took a while to become awake and alert.  Patient went to the ER for evaluation.  CT head and lab testing unremarkable.  Patient was started on antiseizure medication because he had another event a few months prior where he passed out.  That event happened after he had gone for a very long run, felt dehydrated, lightheaded and passed out at home.  Patient has had several concussions when he was younger.  No prior seizures.  No family history of seizures  .  REVIEW OF SYSTEMS: Full 14 system review of systems performed and negative with exception of: As per HPI.  ALLERGIES: Allergies  Allergen Reactions  . Shellfish Allergy Nausea And Vomiting    HOME MEDICATIONS: Outpatient Medications Prior to Visit  Medication Sig Dispense Refill  . cetirizine (ZYRTEC) 10 MG tablet Take 10 mg by mouth as needed for allergies.    Marland Kitchen levETIRAcetam (KEPPRA) 500 MG tablet Take 1 tablet (500 mg total) by mouth 2 (two) times  daily. 180 tablet 4   No facility-administered medications prior to visit.    PAST MEDICAL HISTORY: Past Medical History:  Diagnosis Date  . Acne   . Allergy   . Arm fracture   . Chickenpox   . H/O multiple concussions    high school, lacrosse  . LOC (loss of consciousness) (HCC) 08/2019, 02/2020    PAST SURGICAL HISTORY: Past Surgical History:  Procedure Laterality Date  . NO PAST SURGERIES      FAMILY HISTORY: Family History  Problem Relation Age of Onset  . Heart disease Paternal Grandfather   . Alzheimer's disease Maternal Grandfather   . Heart disease Maternal Grandfather     SOCIAL HISTORY: Social History   Socioeconomic History  . Marital status: Single    Spouse name: Not on file  . Number of children: 0  . Years of education: Not on file  . Highest education level: Bachelor's degree (e.g., BA, AB, BS)  Occupational History  . Not on file  Tobacco Use  . Smoking status: Former Smoker    Quit date: 04/10/2017    Years since quitting: 3.3  . Smokeless tobacco: Never Used  Substance and Sexual Activity  . Alcohol use: Yes    Alcohol/week: 0.0 standard drinks    Comment: Social  . Drug use: Yes    Types: Marijuana    Comment: 04/10/20 socially with alcohol  . Sexual activity:  Yes    Partners: Female  Other Topics Concern  . 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 Difficulty of Paying Living Expenses: Not on file  Food Insecurity:   . Worried About Programme researcher, broadcasting/film/video in the Last Year: Not on file  . Ran Out of Food in the Last Year: Not on file  Transportation Needs:   . Lack of Transportation (Medical): Not on file  . Lack of Transportation  (Non-Medical): Not on file  Physical Activity:   . Days of Exercise per Week: Not on file  . Minutes of Exercise per Session: Not on file  Stress:   . Feeling of Stress : Not on file  Social Connections:   . Frequency of Communication with Friends and Family: Not on file  . Frequency of Social Gatherings with Friends and Family: Not on file  . Attends Religious Services: Not on file  . Active Member of Clubs or Organizations: Not on file  . Attends Banker Meetings: Not on file  . Marital Status: Not on file  Intimate Partner Violence:   . Fear of Current or Ex-Partner: Not on file  . Emotionally Abused: Not on file  . Physically Abused: Not on file  . Sexually Abused: Not on file     PHYSICAL EXAM  GENERAL EXAM/CONSTITUTIONAL: Vitals:  There were no vitals filed for this visit. There is no height or weight on file to calculate BMI. Wt Readings from Last 3 Encounters:  04/10/20 193 lb 6.4 oz (87.7 kg)  03/05/20 192 lb (87.1 kg)  10/10/15 183 lb 9.6 oz (83.3 kg)    Patient is in no distress; well developed, nourished and groomed; neck is supple  CARDIOVASCULAR:  Examination of carotid arteries is normal; no carotid bruits  Regular rate and rhythm, no murmurs  Examination of peripheral vascular system by observation and palpation is normal  EYES:  Ophthalmoscopic exam of optic discs and posterior segments is normal; no papilledema or hemorrhages No exam data present  MUSCULOSKELETAL:  Gait, strength, tone, movements noted in Neurologic exam below  NEUROLOGIC: MENTAL STATUS:  No flowsheet data found.  awake, alert, oriented to person, place and time  recent and remote memory intact  normal attention and concentration  language fluent, comprehension intact, naming intact  fund of knowledge appropriate  CRANIAL NERVE:   2nd - no papilledema on fundoscopic exam  2nd, 3rd, 4th, 6th - pupils equal and reactive to light, visual fields full to  confrontation, extraocular muscles intact, no nystagmus  5th - facial sensation symmetric  7th - facial strength symmetric  8th - hearing intact  9th - palate elevates symmetrically, uvula midline  11th - shoulder shrug symmetric  12th - tongue protrusion midline  MOTOR:   normal bulk and tone, full strength in the BUE, BLE  SENSORY:   normal and symmetric to light touch, temperature, vibration  COORDINATION:   finger-nose-finger, fine finger movements normal  REFLEXES:   deep tendon reflexes present and symmetric  GAIT/STATION:   narrow based gait     DIAGNOSTIC DATA (LABS, IMAGING, TESTING) - I  reviewed patient records, labs, notes, testing and imaging myself where available.  Lab Results  Component Value Date   WBC 5.6 03/05/2020   HGB 17.2 (H) 03/05/2020   HCT 51.6 03/05/2020   MCV 90.7 03/05/2020   PLT PLATELET CLUMPS NOTED ON SMEAR, UNABLE TO ESTIMATE 03/05/2020      Component Value Date/Time   NA 139 03/05/2020 1059   K 4.5 03/05/2020 1059   CL 107 03/05/2020 1059   CO2 19 (L) 03/05/2020 1059   GLUCOSE 162 (H) 03/05/2020 1059   BUN 7 03/05/2020 1059   CREATININE 1.01 03/05/2020 1059   CALCIUM 9.5 03/05/2020 1059   PROT 6.9 03/05/2020 1059   ALBUMIN 4.3 03/05/2020 1059   AST 25 03/05/2020 1059   ALT 40 03/05/2020 1059   ALKPHOS 65 03/05/2020 1059   BILITOT 0.9 03/05/2020 1059   GFRNONAA >60 03/05/2020 1059   GFRAA >60 03/05/2020 1059   No results found for: CHOL, HDL, LDLCALC, LDLDIRECT, TRIG, CHOLHDL No results found for: XBDZ3G No results found for: VITAMINB12 No results found for: TSH   03/05/20 CT head [I reviewed images myself and agree with interpretation. -VRP]  - negative  04/30/20 MRI brain - No acute intracranial abnormality.   05/08/20 EEG - Normal EEG in the awake and drowsy states.     ASSESSMENT AND PLAN  29 y.o. year old male here with new onset LOC suspicious for seizure (03/05/20, 08/20/20). Prior event syncope  vs seizure.   Dx:  1. Seizure disorder (HCC)      PLAN:  SEIZURE DISORDER / ABNORMAL SPELLS (LOC, bit tongue, convulsions; Sept 2020; March 05, 2020; Sept 2021)  - continue levetiracetam 500mg  twice a day   - According to [facility] law, you can not drive unless you are seizure / syncope free for at least 3 months and under physician's care (last seizure may have been provoked by being off medication)  - Please maintain precautions. Do not participate in activities where a loss of awareness could harm you or someone else. No swimming alone, no tub bathing, no hot tubs, no driving, no operating motorized vehicles (cars, ATVs, motocycles, etc), lawnmowers, power tools or firearms. No standing at heights, such as rooftops, ladders or stairs. Avoid hot objects such as stoves, heaters, open fires. Wear a helmet when riding a bicycle, scooter, skateboard, etc. and avoid areas of traffic. Set your water heater to 120 degrees or less.  Meds ordered this encounter  Medications  . levETIRAcetam (KEPPRA) 500 MG tablet    Sig: Take 1 tablet (500 mg total) by mouth 2 (two) times daily.    Dispense:  180 tablet    Refill:  4   Return in about 1 year (around 08/26/2021).     08/28/2021, MD 08/26/2020, 3:05 PM Certified in Neurology, Neurophysiology and Neuroimaging  [facility] [address], [address] [place], [place] [phone]

## 2020-12-02 ENCOUNTER — Telehealth: Payer: Self-pay | Admitting: Diagnostic Neuroimaging

## 2020-12-02 NOTE — Telephone Encounter (Signed)
Pt has sent a mychart message discussing this as well. Will discuss with Dr. Marjory Lies tomorrow.

## 2020-12-02 NOTE — Telephone Encounter (Signed)
Pt's mother Gio Janoski (do not have a DPR) discuss the next step concerning his seizures. Is there something else we can do for the seizures. Feel we have not exhausted all avenues to stop his seizures. He do not want to be on medication the rest of his life. I Pattricia Boss) informed Ms. Sweis, there is not a DPR so we cannot discuss his medical information with you. Told Ms. Terrones her son could come by and fill out a DPR. She said he will come by and fill out a form so you can talk to me.

## 2021-02-26 ENCOUNTER — Other Ambulatory Visit: Payer: Self-pay

## 2021-02-26 ENCOUNTER — Ambulatory Visit
Admission: EM | Admit: 2021-02-26 | Discharge: 2021-02-26 | Disposition: A | Discharge status: Home / Self Care | Payer: BC Managed Care – PPO

## 2021-02-26 DIAGNOSIS — J069 Acute upper respiratory infection, unspecified: Secondary | ICD-10-CM

## 2021-02-26 NOTE — ED Triage Notes (Signed)
Pt presents with c/o intermittent headache, rash on forehead and fatigue, pt concerned for meningitis

## 2021-02-26 NOTE — ED Provider Notes (Signed)
[facility]    CSN: [id number] Arrival date & time: 02/26/21  1640      History   Chief Complaint No chief complaint on file.   HPI Nicholas Freeman is a 30 y.o. male.   Patient presenting with 1 day history of mild intermittent headache, rash on forehead, fatigue, congestion, sore throat, cough.  He is also having some neck soreness and stiffness.  He denies fever, chills, chest pain, shortness of breath, abdominal pain, nausea vomiting diarrhea.  He states his family members are very concerned about him and told him he needs to make sure he does not have meningitis.  Chronic medical problems include seizure disorder on Keppra.  No known sick contacts.  Has not tried anything over-the-counter for symptoms.     Past Medical History:  Diagnosis Date  . Acne   . Allergy   . Arm fracture   . Chickenpox   . H/O multiple concussions    high school, lacrosse  . LOC (loss of consciousness) (HCC) 08/2019, 02/2020    Patient Active Problem List   Diagnosis Date Noted  . Seizure disorder (HCC) 08/26/2020  . Immunization due 02/26/2011  . ABDOMINAL PAIN OTHER SPECIFIED SITE 02/28/2010  . INJURY OF FACE AND NECK OTHER AND UNSPECIFIED 02/28/2010    Past Surgical History:  Procedure Laterality Date  . NO PAST SURGERIES         Home Medications    Prior to Admission medications   Medication Sig Start Date End Date Taking? Authorizing Provider  levETIRAcetam (KEPPRA) 500 MG tablet Take 1 tablet (500 mg total) by mouth 2 (two) times daily. 08/26/20   Penumalli, Glenford Bayley, MD    Family History Family History  Problem Relation Age of Onset  . Heart disease Paternal Grandfather   . Alzheimer's disease Maternal Grandfather   . Heart disease Maternal Grandfather     Social History Social History   Tobacco Use  . Smoking status: Former Smoker    Quit date: 04/10/2017    Years since quitting: 3.8  . Smokeless tobacco: Never Used  Substance Use Topics  . Alcohol  use: Yes    Alcohol/week: 0.0 standard drinks    Comment: Social  . Drug use: Yes    Types: Marijuana    Comment: 04/10/20 socially with alcohol     Allergies   Shellfish allergy   Review of Systems Review of Systems Per HPI  Physical Exam Triage Vital Signs ED Triage Vitals  Enc Vitals Group     BP 02/26/21 1659 122/87     Pulse Rate 02/26/21 1659 81     Resp 02/26/21 1659 20     Temp 02/26/21 1659 99 F (37.2 C)     Temp Source 02/26/21 1659 Oral     SpO2 02/26/21 1659 97 %     Weight --      Height --      Head Circumference --      Peak Flow --      Pain Score 02/26/21 1709 3     Pain Loc --      Pain Edu? --      Excl. in GC? --    No data found.  Updated Vital Signs BP 122/87 (BP Location: Left Arm)   Pulse 81   Temp 99 F (37.2 C) (Oral)   Resp 20   SpO2 97%   Visual Acuity Right Eye Distance:   Left Eye Distance:   Bilateral Distance:  Right Eye Near:   Left Eye Near:    Bilateral Near:     Physical Exam Vitals and nursing note reviewed.  Constitutional:      Appearance: Normal appearance.  HENT:     Head: Atraumatic.     Right Ear: Tympanic membrane normal.     Left Ear: Tympanic membrane normal.     Nose: Rhinorrhea present.     Mouth/Throat:     Mouth: Mucous membranes are moist.     Pharynx: Oropharynx is clear. Posterior oropharyngeal erythema present.  Eyes:     Extraocular Movements: Extraocular movements intact.     Conjunctiva/sclera: Conjunctivae normal.     Pupils: Pupils are equal, round, and reactive to light.  Neck:     Comments: Good motion of C-spine, patient able to comfortably look around the room while talking during interview. Able to touch chin to chest and extend head backward Cardiovascular:     Rate and Rhythm: Normal rate and regular rhythm.     Heart sounds: Normal heart sounds.  Pulmonary:     Effort: Pulmonary effort is normal.     Breath sounds: Normal breath sounds.  Abdominal:     General: Bowel  sounds are normal. There is no distension.     Palpations: Abdomen is soft.     Tenderness: There is no abdominal tenderness. There is no guarding.  Musculoskeletal:        General: Normal range of motion.     Cervical back: Normal range of motion and neck supple. No rigidity.  Lymphadenopathy:     Cervical: No cervical adenopathy.  Skin:    General: Skin is warm and dry.     Comments: Several erythematous macular lines across forehead  Neurological:     General: No focal deficit present.     Mental Status: He is oriented to person, place, and time.  Psychiatric:        Mood and Affect: Mood normal.        Thought Content: Thought content normal.        Judgment: Judgment normal.      UC Treatments / Results  Labs (all labs ordered are listed, but only abnormal results are displayed) Labs Reviewed - No data to display  EKG   Radiology No results found.  Procedures Procedures (including critical care time)  Medications Ordered in UC Medications - No data to display  Initial Impression / Assessment and Plan / UC Course  I have reviewed the triage vital signs and the nursing notes.  Pertinent labs & imaging results that were available during my care of the patient were reviewed by me and considered in my medical decision making (see chart for details).     Exam and vitals very reassuring today.  Suspect early early stage of a cold.  The rash on his forehead is consistent with irritation, he states he was wearing a backwards hat yesterday watching TV and thinks that the adjustable strap was rubbing on his head.  No evidence of meningitis, declines Covid and flu testing which is appropriate given his symptoms.  Suspect viral URI, discussed DayQuil, NyQuil, supportive home care.  Return for acutely worsening symptoms.  Quarantine until symptoms improving.  Final Clinical Impressions(s) / UC Diagnoses   Final diagnoses:  Viral URI   Discharge Instructions   None     ED Prescriptions    None     PDMP not reviewed this encounter.   Particia Nearing, [place] 02/26/21  1750  

## 2021-03-03 ENCOUNTER — Emergency Department (HOSPITAL_BASED_OUTPATIENT_CLINIC_OR_DEPARTMENT_OTHER): Payer: BC Managed Care – PPO

## 2021-03-03 ENCOUNTER — Other Ambulatory Visit: Payer: Self-pay

## 2021-03-03 ENCOUNTER — Encounter (HOSPITAL_BASED_OUTPATIENT_CLINIC_OR_DEPARTMENT_OTHER): Payer: Self-pay | Admitting: Emergency Medicine

## 2021-03-03 ENCOUNTER — Telehealth: Payer: Self-pay | Admitting: Diagnostic Neuroimaging

## 2021-03-03 ENCOUNTER — Emergency Department (HOSPITAL_BASED_OUTPATIENT_CLINIC_OR_DEPARTMENT_OTHER)
Admission: EM | Admit: 2021-03-03 | Discharge: 2021-03-03 | Disposition: A | Discharge status: Home / Self Care | Payer: BC Managed Care – PPO | Attending: Emergency Medicine | Admitting: Emergency Medicine

## 2021-03-03 DIAGNOSIS — Y990 Civilian activity done for income or pay: Secondary | ICD-10-CM | POA: Diagnosis not present

## 2021-03-03 DIAGNOSIS — W2209XA Striking against other stationary object, initial encounter: Secondary | ICD-10-CM | POA: Insufficient documentation

## 2021-03-03 DIAGNOSIS — S0990XA Unspecified injury of head, initial encounter: Secondary | ICD-10-CM | POA: Diagnosis present

## 2021-03-03 DIAGNOSIS — G40909 Epilepsy, unspecified, not intractable, without status epilepticus: Secondary | ICD-10-CM | POA: Diagnosis not present

## 2021-03-03 DIAGNOSIS — Y9289 Other specified places as the place of occurrence of the external cause: Secondary | ICD-10-CM | POA: Diagnosis not present

## 2021-03-03 DIAGNOSIS — Z87891 Personal history of nicotine dependence: Secondary | ICD-10-CM | POA: Insufficient documentation

## 2021-03-03 DIAGNOSIS — R569 Unspecified convulsions: Secondary | ICD-10-CM

## 2021-03-03 DIAGNOSIS — S0003XA Contusion of scalp, initial encounter: Secondary | ICD-10-CM | POA: Insufficient documentation

## 2021-03-03 LAB — CBG MONITORING, ED: Glucose-Capillary: 105 mg/dL — ABNORMAL HIGH (ref 70–99)

## 2021-03-03 MED ORDER — LEVETIRACETAM 250 MG PO TABS: 250.0000 mg | ORAL_TABLET | Freq: Two times a day (BID) | ORAL | 0 refills | Status: AC

## 2021-03-03 MED ORDER — LEVETIRACETAM 500 MG PO TABS
500.0000 mg | ORAL_TABLET | Freq: Once | ORAL | Status: AC
  Administered 2021-03-03: 500 mg via ORAL
  Filled 2021-03-03: qty 1

## 2021-03-03 NOTE — Telephone Encounter (Addendum)
Per Dr Marjory Lies, referral placed to Dr Lindie Spruce, [facility].   Called mother, LVM informing her Dr Mahlon Gammon has viewed ED notes, scan, labs. Advised he placed referral to Dr Melynda Ripple. Left # for questions.

## 2021-03-03 NOTE — Telephone Encounter (Signed)
Noted that patient is in ED now, labs, CT scan ordered.

## 2021-03-03 NOTE — ED Triage Notes (Signed)
Pt has Hx of seizures. Pt stated that he had a seizure to today at approximately 1215. Pt stated that at 1245he started regaining his senses. Pt did not remember seizure and does not know how long it lasted. Pt states that he has a bump on the back side of his head but does remember hitting his head. Pt thinks he as sitting but stated that he may have been standing that he is not sure. Pt thinks he may have felt seizure coming on. Pt. denies N/V and some pain to inside of lip that he bit.

## 2021-03-03 NOTE — ED Notes (Signed)
Patient transported to CT 

## 2021-03-03 NOTE — Telephone Encounter (Signed)
Called mother who stated he had seizure  at work today, hit back of head. He had LOC but she stated he always dies when he has a seizure.   Patient is now at home with his father. Mom is out of state. She stated she talked to him;  he is groggy.  She would like a CT scan of his head ordered to be done asap. She does not want father to take him to ED. I advised he go to Urgent Care.  She also asks for a referral to Dr Melynda Ripple, [facility] epilepsy clinic.  I advised will send to Dr Marjory Lies and let her know. She stated she's going to call some UrgentCares to see if she can find one that does CT scans,  verbalized understanding, appreciation.

## 2021-03-03 NOTE — Telephone Encounter (Signed)
Pt's mother(on DPR) has called to report pt had another seizure and hit his head. Mother asking an urgent request for a referral for a CT scan today. Mother also asking for a referral for the Epilepsy testing unit at [facility].  Please call pt's mother

## 2021-03-03 NOTE — Addendum Note (Signed)
Addended by: [place] Pink on: 03/03/2021 04:48 PM   Modules accepted: Orders

## 2021-03-03 NOTE — ED Provider Notes (Signed)
[facility] EMERGENCY DEPT Provider Note   CSN: [id number] Arrival date & time: 03/03/21  1343     History Chief Complaint  Patient presents with  . Seizures    Nicholas Freeman is a 30 y.o. male.  HPI Patient presents after seizure.  History of seizure disorder.  Last seizure was in November.  Sees Dr. Marjory Lies.  States he is compliant with his medicines and is sure he took it last night and today.  States he was at work and has been feeling little off.  States later found on the floor.  States he thinks he hit the back of his head.  Was confused after but now back to his baseline.  Is here with his father.  No neck pain.  States he has had some nasal congestion which is not unusual for him, particular this time of year.  He is on Keppra at 500 mg a day.  No fevers or chills.    Past Medical History:  Diagnosis Date  . Acne   . Allergy   . Arm fracture   . Chickenpox   . H/O multiple concussions    high school, lacrosse  . LOC (loss of consciousness) (HCC) 08/2019, 02/2020    Patient Active Problem List   Diagnosis Date Noted  . Seizure disorder (HCC) 08/26/2020  . Immunization due 02/26/2011  . ABDOMINAL PAIN OTHER SPECIFIED SITE 02/28/2010  . INJURY OF FACE AND NECK OTHER AND UNSPECIFIED 02/28/2010    Past Surgical History:  Procedure Laterality Date  . NO PAST SURGERIES         Family History  Problem Relation Age of Onset  . Heart disease Paternal Grandfather   . Alzheimer's disease Maternal Grandfather   . Heart disease Maternal Grandfather     Social History   Tobacco Use  . Smoking status: Former Smoker    Quit date: 04/10/2017    Years since quitting: 3.8  . Smokeless tobacco: Never Used  Substance Use Topics  . Alcohol use: Yes    Alcohol/week: 0.0 standard drinks    Comment: Social  . Drug use: Yes    Types: Marijuana    Comment: 04/10/20 socially with alcohol    Home Medications Prior to Admission medications   Medication Sig  Start Date End Date Taking? Authorizing Provider  levETIRAcetam (KEPPRA) 250 MG tablet Take 1 tablet (250 mg total) by mouth 2 (two) times daily. Add to the 500 mg tablets for a total of 750 mg twice a day. 03/03/21  Yes Benjiman Core, MD  levETIRAcetam (KEPPRA) 500 MG tablet Take 1 tablet (500 mg total) by mouth 2 (two) times daily. 08/26/20  Yes Penumalli, Glenford Bayley, MD    Allergies    Shellfish allergy  Review of Systems   Review of Systems  Constitutional: Negative for appetite change and fever.  HENT: Positive for congestion.   Respiratory: Negative for shortness of breath.   Cardiovascular: Negative for chest pain.  Gastrointestinal: Negative for abdominal pain.  Endocrine: Negative for polyuria.  Genitourinary: Negative for flank pain.  Musculoskeletal: Negative for back pain.  Skin: Negative for rash.  Neurological: Positive for seizures.  Psychiatric/Behavioral: Negative for confusion.    Physical Exam Updated Vital Signs BP 118/81 (BP Location: Right Arm)   Pulse 66   Temp (!) 97.5 F (36.4 C) (Oral)   Resp 20   Ht 5\' 11"  (1.803 m)   Wt 81.6 kg   SpO2 100%   BMI 25.10 kg/m  Physical Exam Vitals and nursing note reviewed.  Constitutional:      Appearance: Normal appearance.  HENT:     Head:     Comments: Hematoma to left occipital area. Eyes:     Pupils: Pupils are equal, round, and reactive to light.  Pulmonary:     Breath sounds: No wheezing or rhonchi.  Abdominal:     Tenderness: There is no abdominal tenderness.  Musculoskeletal:        General: No tenderness.     Cervical back: Neck supple.  Skin:    General: Skin is warm.     Capillary Refill: Capillary refill takes less than 2 seconds.     Coloration: Skin is not jaundiced.  Neurological:     Mental Status: He is alert and oriented to person, place, and time.  Psychiatric:        Mood and Affect: Mood normal.     ED Results / Procedures / Treatments   Labs (all labs ordered are  listed, but only abnormal results are displayed) Labs Reviewed  CBG MONITORING, ED - Abnormal; Notable for the following components:      Result Value   Glucose-Capillary 105 (*)    All other components within normal limits  LEVETIRACETAM LEVEL    EKG None  Radiology No results found.  Procedures Procedures   Medications Ordered in ED Medications  levETIRAcetam (KEPPRA) tablet 500 mg (500 mg Oral Given 03/03/21 1442)    ED Course  I have reviewed the triage vital signs and the nursing notes.  Pertinent labs & imaging results that were available during my care of the patient were reviewed by me and considered in my medical decision making (see chart for details).    MDM Rules/Calculators/A&P                          Patient with seizure.  History of seizure disorder.  Is on 500 mg Keppra.  Well-appearing.  Has reportedly been sleeping well and took his medicines.  However has had some sinus congestion type problems, somewhat acute on chronic.  Will increase his Keppra to 750 mg from 500 mg.  Head CT done due to trauma to head.  Results still pending.  If CT scan shows evidence of sinusitis will treat with antibiotics otherwise just outpatient follow-up with Dr. Marjory Lies.  Care however Dr. Wilkie Aye Final Clinical Impression(s) / ED Diagnoses Final diagnoses:  Seizure [facility])    Rx / DC Orders ED Discharge Orders         Ordered    levETIRAcetam (KEPPRA) 250 MG tablet  2 times daily        03/03/21 1449           Benjiman Core, MD 03/03/21 1506

## 2021-03-03 NOTE — ED Notes (Signed)
Back from imaging.

## 2021-03-04 NOTE — Addendum Note (Signed)
Addended by: Joycelyn Schmid R on: 03/04/2021 10:25 AM   Modules accepted: Orders

## 2021-03-04 NOTE — Telephone Encounter (Signed)
Orders Placed This Encounter  Procedures  . Ambulatory referral to Neurology    Suanne Marker, MD 03/04/2021, 10:25 AM Certified in Neurology, Neurophysiology and Neuroimaging  [facility] [address], [address] [place], [place] [address] [phone]

## 2021-03-04 NOTE — Telephone Encounter (Signed)
Called mother and advised her Dr Marjory Lies reviewed ED notes, tests, and sent in referral to Dr Melynda Ripple. Per ED MD, he is to follow up with Dr Marjory Lies. Mom stated she called [facility], and he has apt at [facility] 04/01/21. They will keep appointment at [facility] and not see Dr Melynda Ripple. She will contact Dr Candi Leash office. She will contact our office if he needs referral to [facility]. I advised her that if he establishes with [facility] neurologist he will not need to follow up here. She  verbalized understanding, appreciation.

## 2021-03-06 LAB — LEVETIRACETAM LEVEL: Levetiracetam Lvl: 10.2 ug/mL (ref 10.0–40.0)

## 2021-03-25 ENCOUNTER — Telehealth: Payer: Self-pay | Admitting: *Deleted

## 2021-03-25 NOTE — Telephone Encounter (Signed)
Left message pt needs to sign a release form.

## 2021-09-02 ENCOUNTER — Telehealth: Payer: BC Managed Care – PPO | Admitting: Family Medicine

## 2022-08-11 IMAGING — CT CT HEAD W/O CM
2 series · 15 of 30 positions shown, 17 images · non-contrast
Comparison: CT head 03/05/2020

CLINICAL DATA: Abnormal mental status.

EXAM:
CT HEAD WITHOUT CONTRAST
TECHNIQUE: Contiguous axial images were obtained from the base of the skull
through the vertex without intravenous contrast.

[Series 2: head wo · axial · 0.44mm/px · z∈[-58,+62]mm · 7 of 32 slices shown, 9 images]
[im 4/32  brain]
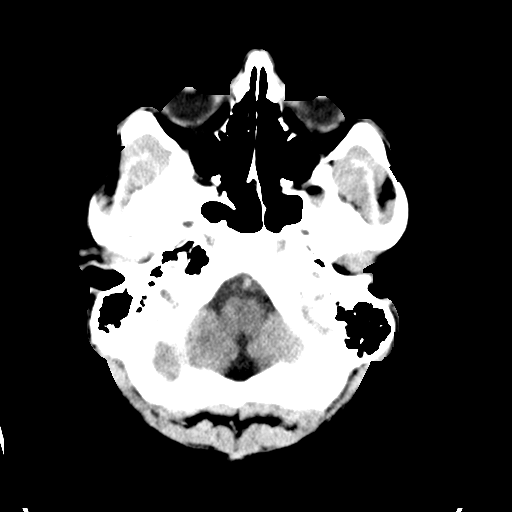
[im 4/32  bone]
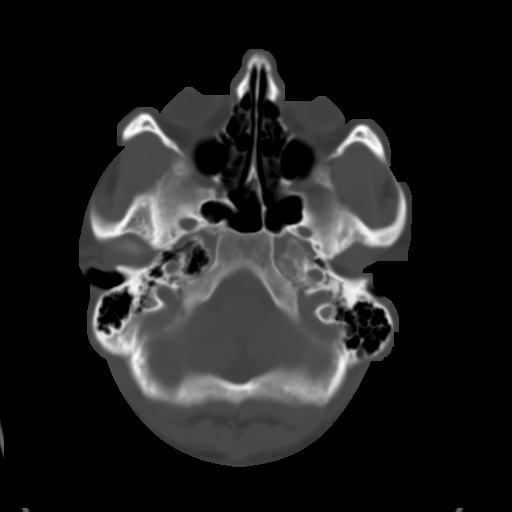
[im 8/32  brain]
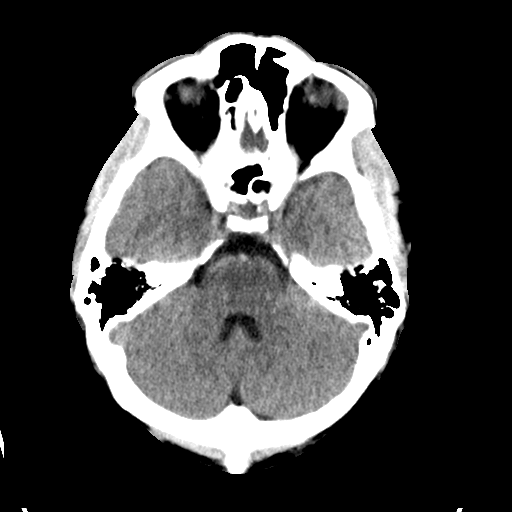
[im 12/32  brain]
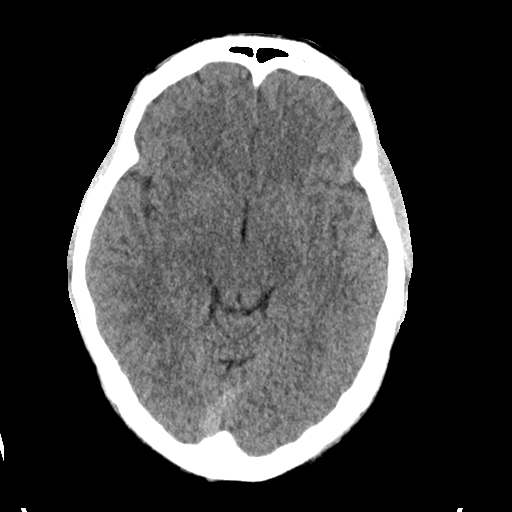
[im 16/32  brain]
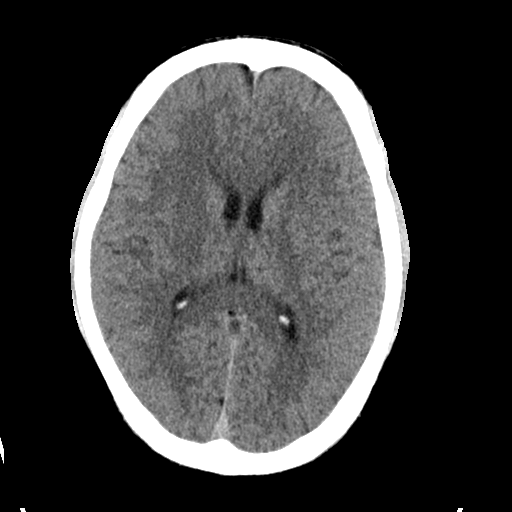
[im 20/32  brain]
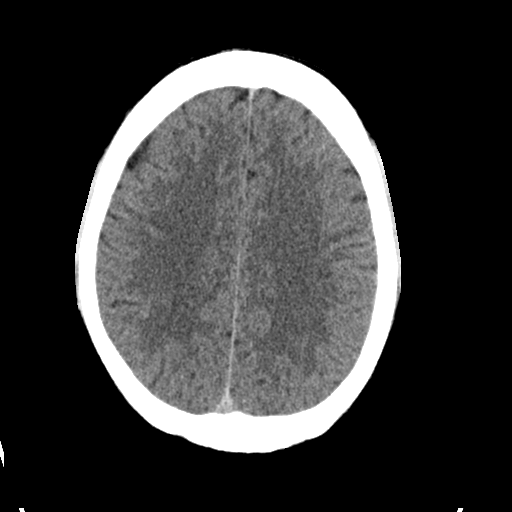
[im 20/32  bone]
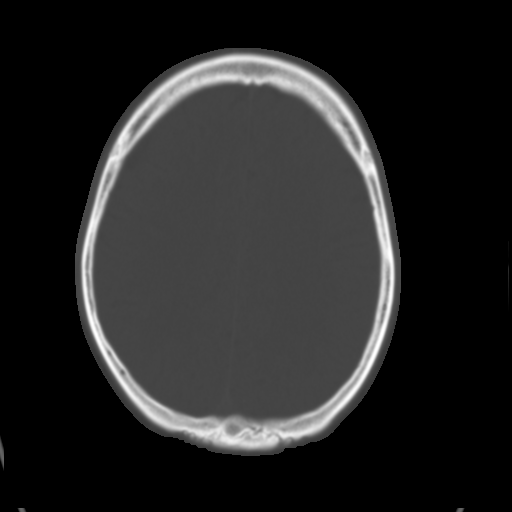
[im 24/32  brain]
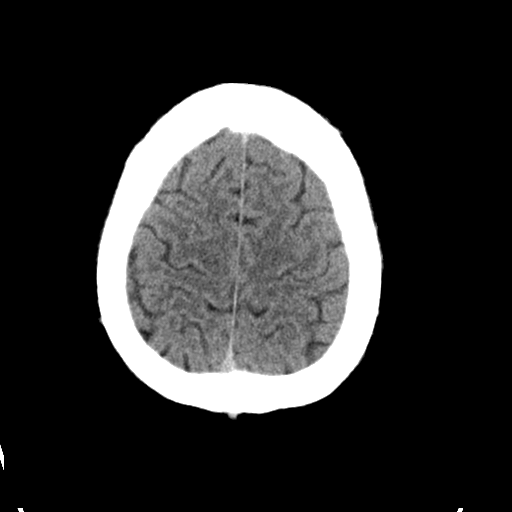
[im 28/32  brain]
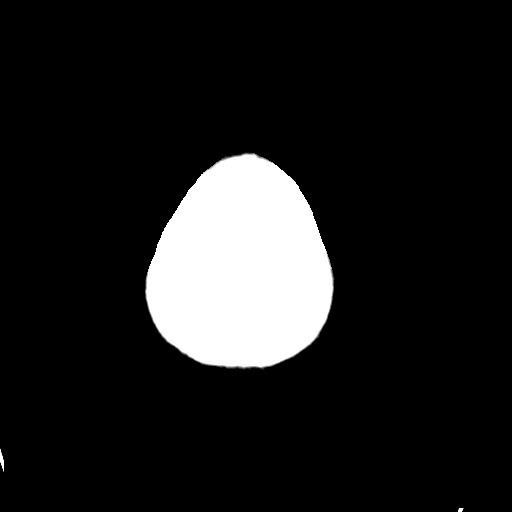

[Series 3: head bone · axial · 0.44mm/px · z∈[-59,+67]mm · 8 of 79 slices shown]
[im 8/79  bone]
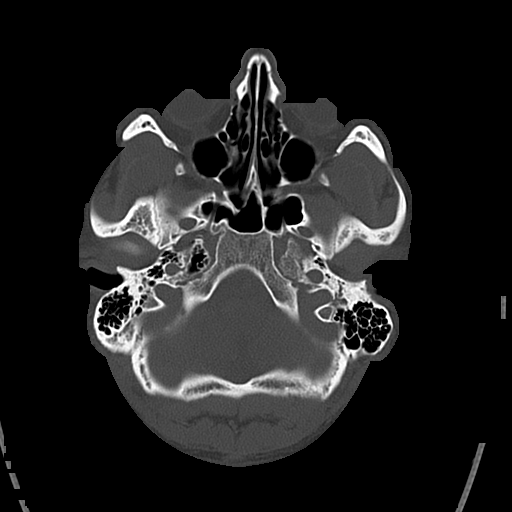
[im 16/79  bone]
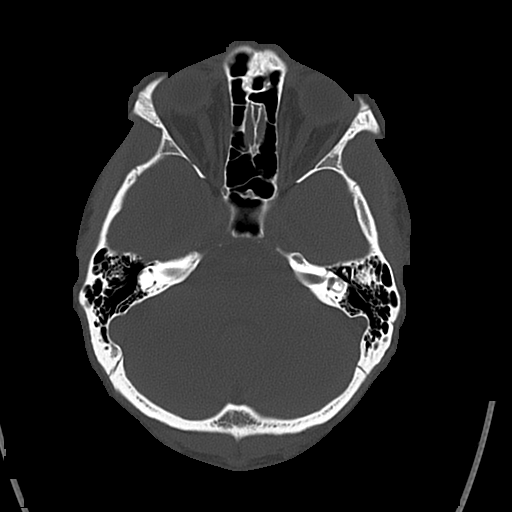
[im 24/79  bone]
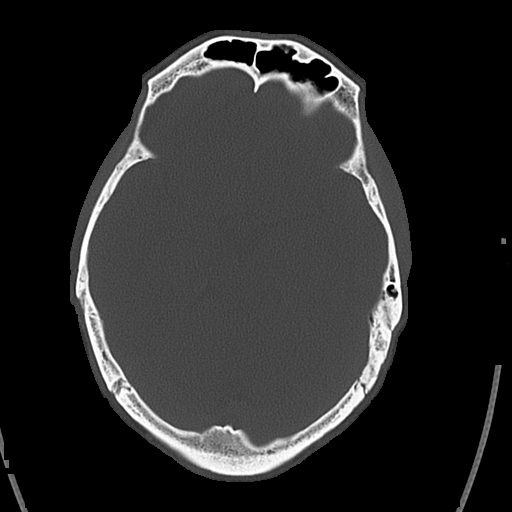
[im 36/79  bone]
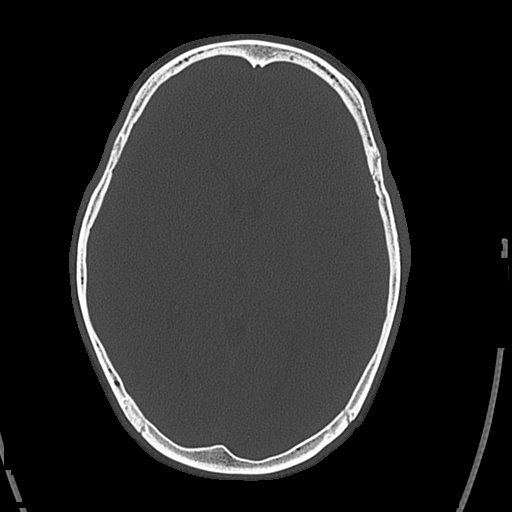
[im 43/79  bone]
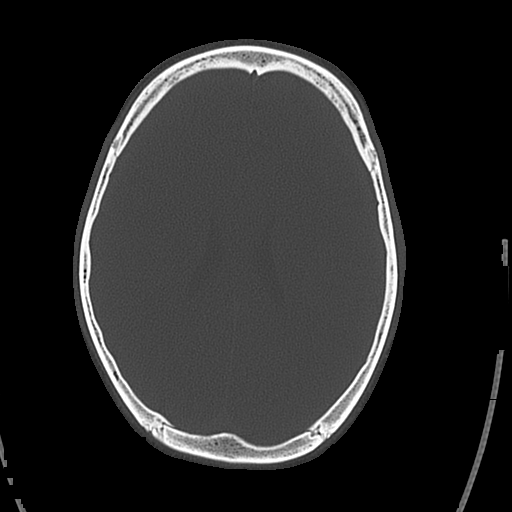
[im 55/79  bone]
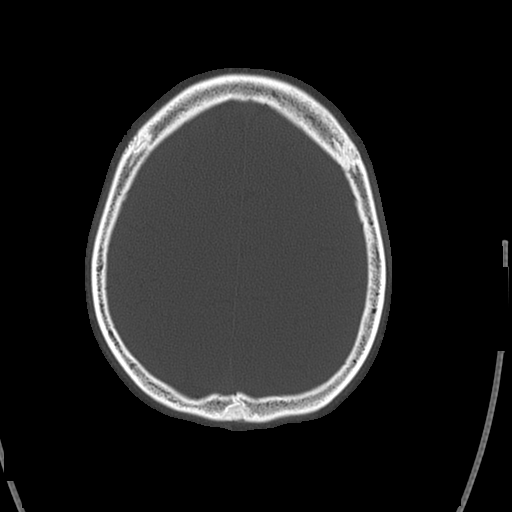
[im 63/79  bone]
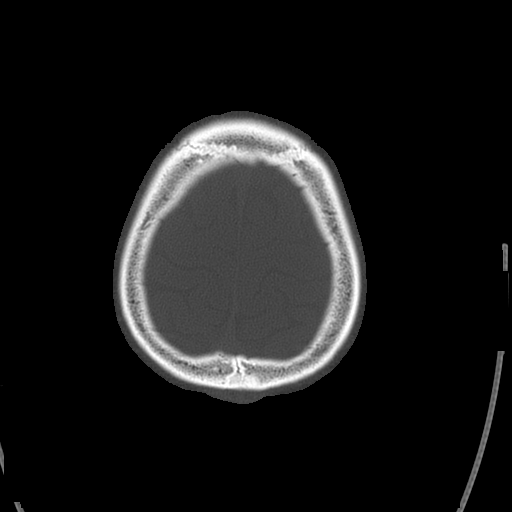
[im 71/79  bone]
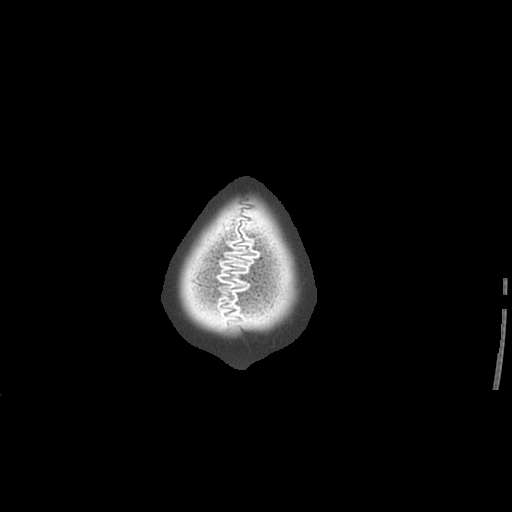

[15 of 30 positions shown; findings below may reference images not displayed]

FINDINGS: Brain:

No evidence of large-territorial acute infarction. No parenchymal
hemorrhage. No mass lesion. No extra-axial collection.

No mass effect or midline shift. No hydrocephalus. Basilar cisterns
are patent.

Vascular: No hyperdense vessel.

Skull: No acute fracture or focal lesion.

Sinuses/Orbits: Paranasal sinuses and mastoid air cells are clear.
The orbits are unremarkable.

Other: None.
IMPRESSION: No acute intracranial abnormality.

## 2024-04-14 ENCOUNTER — Ambulatory Visit (INDEPENDENT_AMBULATORY_CARE_PROVIDER_SITE_OTHER): Payer: BC Managed Care – PPO
